# Patient Record
Sex: Female | Born: 1995 | Race: White | Hispanic: No | Marital: Single
Health system: Southern US, Community
[De-identification: ages and names within clinical notes are randomized; demographics above are authoritative.]

## PROBLEM LIST (undated history)

## (undated) DIAGNOSIS — E8809 Other disorders of plasma-protein metabolism, not elsewhere classified: Secondary | ICD-10-CM

---
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
## 2019-03-31 ENCOUNTER — Other Ambulatory Visit: Payer: Self-pay

## 2019-03-31 DIAGNOSIS — N6452 Nipple discharge: Secondary | ICD-10-CM

## 2019-03-31 NOTE — Progress Notes (Signed)
Patient ID: Paula Hicks, female   DOB: 1996-06-08, 23 y.o.   MRN: [id number]   Patient referred to [name] from [facility] for bilateral nipple discharge x 2 months.  Reports discharge to be clear, and or creamy, and notices daily.  Verbal consent for [facility].  To arrive at [facility] on 04/04/2019 at 9:30 for a 9:40 diagnostic mammogram and ultrasound appointment.  Patient reports she thinks her mother and a maternal aunt had ovarian cancer, and her maternal grandmother possibly had breast cancer.  She will find out, and contact clinic.

## 2019-04-04 ENCOUNTER — Ambulatory Visit
Admission: RE | Admit: 2019-04-04 | Discharge: 2019-04-04 | Disposition: A | Discharge status: Home / Self Care | Payer: Self-pay | Source: Ambulatory Visit | Attending: Oncology | Admitting: Oncology

## 2019-04-04 ENCOUNTER — Other Ambulatory Visit: Payer: Self-pay

## 2019-04-04 ENCOUNTER — Ambulatory Visit: Payer: Self-pay | Attending: Oncology

## 2019-04-04 DIAGNOSIS — N6452 Nipple discharge: Secondary | ICD-10-CM | POA: Insufficient documentation

## 2019-06-18 NOTE — Progress Notes (Signed)
Radiologist reviewed negative findings with patient.   To return to clinic as needed, and for annual screening mammograms at 1. Copy to [facility].
